# Patient Record
Sex: Female | Born: 1995 | Race: Black or African American | Hispanic: No | Marital: Single | Smoking: Never smoker
Health system: Southern US, Community
[De-identification: ages and names within clinical notes are randomized; demographics above are authoritative.]

---

## 2014-05-18 LAB — PROCEDURE REPORT - SCANNED: PAP SMEAR: ABNORMAL — AB

## 2014-06-08 ENCOUNTER — Encounter (HOSPITAL_COMMUNITY): Payer: Self-pay | Admitting: Emergency Medicine

## 2014-06-08 ENCOUNTER — Emergency Department (HOSPITAL_COMMUNITY)
Admission: EM | Admit: 2014-06-08 | Discharge: 2014-06-08 | Disposition: A | Discharge status: Home / Self Care | Attending: Emergency Medicine | Admitting: Emergency Medicine

## 2014-06-08 DIAGNOSIS — R110 Nausea: Secondary | ICD-10-CM | POA: Insufficient documentation

## 2014-06-08 DIAGNOSIS — R05 Cough: Secondary | ICD-10-CM | POA: Diagnosis present

## 2014-06-08 DIAGNOSIS — B9789 Other viral agents as the cause of diseases classified elsewhere: Secondary | ICD-10-CM

## 2014-06-08 DIAGNOSIS — J069 Acute upper respiratory infection, unspecified: Secondary | ICD-10-CM

## 2014-06-08 MED ORDER — ONDANSETRON HCL 4 MG PO TABS: 4.0000 mg | ORAL_TABLET | Freq: Four times a day (QID) | ORAL | Status: AC

## 2014-06-08 MED ORDER — DEXTROMETHORPHAN POLISTIREX 30 MG/5ML PO LQCR: 30.0000 mg | ORAL | Status: AC | PRN

## 2014-06-08 MED ORDER — ALBUTEROL SULFATE HFA 108 (90 BASE) MCG/ACT IN AERS: 1.0000 | INHALATION_SPRAY | Freq: Four times a day (QID) | RESPIRATORY_TRACT | Status: AC | PRN

## 2014-06-08 NOTE — Discharge Instructions (Signed)
Take the prescribed medication as directed.  Use albuterol if needed. Drink fluids and rest.  Keep hands washed as much as possible. Return to the ED for new or worsening symptoms.

## 2014-06-08 NOTE — ED Provider Notes (Signed)
Medical screening examination/treatment/procedure(s) were performed by non-physician practitioner and as supervising physician I was immediately available for consultation/collaboration.   EKG Interpretation None       Krishana Lutze, MD 06/08/14 2350 

## 2014-06-08 NOTE — ED Provider Notes (Signed)
CSN: [id number]     Arrival date & time 06/08/14  1451 History   First MD Initiated Contact with Patient 06/08/14 1818     Chief Complaint  Patient presents with  . URI     (Consider location/radiation/quality/duration/timing/severity/associated sxs/prior Treatment) The history is provided by the patient and medical records.   This is a 18 year old female with no significant past medical history presenting to the ED for URI type symptoms for the past 2 days. Specifically she has had nonproductive cough, nasal congestion, sore throat, and generalized body aches. Endorses subjective fever but denies chills or sweats. States all of her roommates are sick with similar symptoms. She denies any chest pain or shortness of breath. Some nausea with post-tussive vomiting, no vomiting otherwise.  No diarrhea.  Denies abdominal pain.  Decreased appetite but tolerating PO.  Patient has no prior history of asthma. No intervention tried prior to arrival.  History reviewed. No pertinent past medical history. No past surgical history on file. No family history on file. History  Substance Use Topics  . Smoking status: Not on file  . Smokeless tobacco: Not on file  . Alcohol Use: Not on file   OB History   Grav Para Term Preterm Abortions TAB SAB Ect Mult Living                 Review of Systems  HENT: Positive for congestion and sore throat.   Respiratory: Positive for cough.   All other systems reviewed and are negative.     Allergies  Review of patient's allergies indicates no known allergies.  Home Medications   Prior to Admission medications   Not on File   BP 103/65  Pulse 73  Temp(Src) 98.4 F (36.9 C) (Oral)  SpO2 100%  Physical Exam  Nursing note and vitals reviewed. Constitutional: She is oriented to person, place, and time. She appears well-developed and well-nourished. No distress.  HENT:  Head: Normocephalic and atraumatic.  Right Ear: Tympanic membrane and ear canal  normal.  Left Ear: Tympanic membrane and ear canal normal.  Nose: Nose normal.  Mouth/Throat: Uvula is midline, oropharynx is clear and moist and mucous membranes are normal. No oropharyngeal exudate, posterior oropharyngeal edema or posterior oropharyngeal erythema.  Tonsils normal in appearance bilaterally without exudate; uvula midline without peritonsillar abscess; handling secretions appropriately; no difficulty swallowing or speaking  Eyes: Conjunctivae and EOM are normal. Pupils are equal, round, and reactive to light.  Neck: Normal range of motion. Neck supple.  Cardiovascular: Normal rate, regular rhythm and normal heart sounds.   Pulmonary/Chest: Effort normal and breath sounds normal. No respiratory distress. She has no wheezes. She has no rales.  Abdominal: Soft. Bowel sounds are normal. There is no tenderness. There is no guarding.  Musculoskeletal: Normal range of motion. She exhibits no edema.  Neurological: She is alert and oriented to person, place, and time.  Skin: Skin is warm and dry. She is not diaphoretic.  Psychiatric: She has a normal mood and affect.    ED Course  Procedures (including critical care time) Labs Review Labs Reviewed - No data to display  Imaging Review No results found.   EKG Interpretation None      MDM   Final diagnoses:  Viral URI with cough   18 year old female with URI type symptoms. On exam, patient afebrile and nontoxic appearing. Her exam is overall noninfectious. Her lungs are clear without wheezes or rhonchi to suggest pneumonia. No chest pain or SOB noted.  Some post-tussive vomiting noted, no abdominal pain.  Abdominal exam benign.  VS stable.  Constellation of symptoms most likely due to viral URI. Patient instructed on symptomatic care.  encouraged rest and plenty of fluids.  Discussed plan with patient, he/she acknowledged understanding and agreed with plan of care.  Return precautions given for new or worsening  symptoms.  Garlon HatchetLisa M Joni Colegrove, PA-C 06/08/14 1910

## 2014-06-08 NOTE — ED Notes (Signed)
Pt in c/o cough and congestion with sore throat and body aches since yesterday, unsure of fever at home, no distress noted

## 2016-05-07 ENCOUNTER — Encounter: Payer: Self-pay | Admitting: *Deleted

## 2017-06-26 ENCOUNTER — Encounter (HOSPITAL_COMMUNITY): Payer: Self-pay | Admitting: Emergency Medicine

## 2017-06-26 ENCOUNTER — Emergency Department (HOSPITAL_COMMUNITY)

## 2017-06-26 ENCOUNTER — Emergency Department (HOSPITAL_COMMUNITY)
Admission: EM | Admit: 2017-06-26 | Discharge: 2017-06-26 | Disposition: A | Discharge status: Home / Self Care | Attending: Emergency Medicine | Admitting: Emergency Medicine

## 2017-06-26 DIAGNOSIS — Z79899 Other long term (current) drug therapy: Secondary | ICD-10-CM | POA: Insufficient documentation

## 2017-06-26 DIAGNOSIS — Y9389 Activity, other specified: Secondary | ICD-10-CM | POA: Insufficient documentation

## 2017-06-26 DIAGNOSIS — Y999 Unspecified external cause status: Secondary | ICD-10-CM | POA: Diagnosis not present

## 2017-06-26 DIAGNOSIS — S2020XA Contusion of thorax, unspecified, initial encounter: Secondary | ICD-10-CM | POA: Diagnosis not present

## 2017-06-26 DIAGNOSIS — X58XXXA Exposure to other specified factors, initial encounter: Secondary | ICD-10-CM | POA: Insufficient documentation

## 2017-06-26 DIAGNOSIS — Y929 Unspecified place or not applicable: Secondary | ICD-10-CM | POA: Diagnosis not present

## 2017-06-26 DIAGNOSIS — S20219A Contusion of unspecified front wall of thorax, initial encounter: Secondary | ICD-10-CM

## 2017-06-26 DIAGNOSIS — S299XXA Unspecified injury of thorax, initial encounter: Secondary | ICD-10-CM | POA: Diagnosis present

## 2017-06-26 NOTE — Discharge Instructions (Signed)
Follow up with your doctor. Return here for worsening symptoms.  °
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
## 2018-03-19 ENCOUNTER — Other Ambulatory Visit (HOSPITAL_COMMUNITY): Payer: Self-pay | Admitting: Family

## 2018-09-03 ENCOUNTER — Ambulatory Visit: Admitting: Obstetrics & Gynecology
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
## 2019-03-13 ENCOUNTER — Other Ambulatory Visit (HOSPITAL_COMMUNITY): Payer: Self-pay | Admitting: Family
